# Patient Record
Sex: Female | Born: 1950 | Race: White | Hispanic: No | Marital: Married
Health system: Southern US, Community
[De-identification: ages and names within clinical notes are randomized; demographics above are authoritative.]

---

## 1999-05-02 ENCOUNTER — Ambulatory Visit (HOSPITAL_COMMUNITY): Admission: RE | Admit: 1999-05-02 | Discharge: 1999-05-02 | Payer: Self-pay | Admitting: Family Medicine

## 1999-05-02 ENCOUNTER — Encounter: Payer: Self-pay | Admitting: Family Medicine

## 1999-05-15 ENCOUNTER — Encounter: Admission: RE | Admit: 1999-05-15 | Discharge: 1999-08-13 | Payer: Self-pay | Admitting: Neurosurgery

## 1999-12-31 ENCOUNTER — Encounter: Payer: Self-pay | Admitting: *Deleted

## 1999-12-31 ENCOUNTER — Encounter: Admission: RE | Admit: 1999-12-31 | Discharge: 1999-12-31 | Payer: Self-pay | Admitting: *Deleted

## 2000-12-22 ENCOUNTER — Other Ambulatory Visit: Admission: RE | Admit: 2000-12-22 | Discharge: 2000-12-22 | Payer: Self-pay | Admitting: Obstetrics and Gynecology

## 2001-01-02 ENCOUNTER — Encounter: Payer: Self-pay | Admitting: Obstetrics and Gynecology

## 2001-01-02 ENCOUNTER — Encounter: Admission: RE | Admit: 2001-01-02 | Discharge: 2001-01-02 | Payer: Self-pay | Admitting: Obstetrics and Gynecology

## 2002-01-17 ENCOUNTER — Encounter: Admission: RE | Admit: 2002-01-17 | Discharge: 2002-01-17 | Payer: Self-pay | Admitting: Obstetrics and Gynecology

## 2002-01-17 ENCOUNTER — Encounter: Payer: Self-pay | Admitting: Obstetrics and Gynecology

## 2002-01-22 ENCOUNTER — Other Ambulatory Visit: Admission: RE | Admit: 2002-01-22 | Discharge: 2002-01-22 | Payer: Self-pay | Admitting: Obstetrics and Gynecology

## 2003-02-04 ENCOUNTER — Encounter: Admission: RE | Admit: 2003-02-04 | Discharge: 2003-02-04 | Payer: Self-pay | Admitting: Obstetrics and Gynecology

## 2003-02-26 ENCOUNTER — Other Ambulatory Visit: Admission: RE | Admit: 2003-02-26 | Discharge: 2003-02-26 | Payer: Self-pay | Admitting: Obstetrics and Gynecology

## 2004-04-17 ENCOUNTER — Other Ambulatory Visit: Admission: RE | Admit: 2004-04-17 | Discharge: 2004-04-17 | Payer: Self-pay | Admitting: Obstetrics and Gynecology

## 2004-04-27 ENCOUNTER — Ambulatory Visit (HOSPITAL_COMMUNITY): Admission: RE | Admit: 2004-04-27 | Discharge: 2004-04-27 | Payer: Self-pay | Admitting: Obstetrics and Gynecology

## 2004-08-11 ENCOUNTER — Ambulatory Visit (HOSPITAL_COMMUNITY): Admission: RE | Admit: 2004-08-11 | Discharge: 2004-08-11 | Payer: Self-pay | Admitting: Orthopaedic Surgery

## 2004-08-11 ENCOUNTER — Ambulatory Visit (HOSPITAL_BASED_OUTPATIENT_CLINIC_OR_DEPARTMENT_OTHER): Admission: RE | Admit: 2004-08-11 | Discharge: 2004-08-11 | Payer: Self-pay | Admitting: Orthopaedic Surgery

## 2005-05-18 ENCOUNTER — Other Ambulatory Visit: Admission: RE | Admit: 2005-05-18 | Discharge: 2005-05-18 | Payer: Self-pay | Admitting: Obstetrics and Gynecology

## 2005-11-17 ENCOUNTER — Encounter: Admission: RE | Admit: 2005-11-17 | Discharge: 2005-11-17 | Payer: Self-pay | Admitting: Family Medicine

## 2007-08-21 ENCOUNTER — Encounter: Admission: RE | Admit: 2007-08-21 | Discharge: 2007-08-21 | Payer: Self-pay | Admitting: Family Medicine

## 2007-11-28 ENCOUNTER — Encounter: Admission: RE | Admit: 2007-11-28 | Discharge: 2007-11-28 | Payer: Self-pay | Admitting: Family Medicine

## 2008-09-26 ENCOUNTER — Ambulatory Visit (HOSPITAL_COMMUNITY): Admission: RE | Admit: 2008-09-26 | Discharge: 2008-09-26 | Payer: Self-pay | Admitting: Family Medicine

## 2009-02-25 ENCOUNTER — Encounter: Admission: RE | Admit: 2009-02-25 | Discharge: 2009-02-25 | Payer: Self-pay | Admitting: Family Medicine

## 2010-01-26 ENCOUNTER — Ambulatory Visit (HOSPITAL_COMMUNITY)
Admission: RE | Admit: 2010-01-26 | Discharge: 2010-01-26 | Payer: Self-pay | Source: Home / Self Care | Admitting: Family Medicine

## 2010-03-29 ENCOUNTER — Encounter: Payer: Self-pay | Admitting: Family Medicine

## 2010-03-30 ENCOUNTER — Encounter: Payer: Self-pay | Admitting: Gastroenterology

## 2010-07-24 NOTE — Op Note (Signed)
[name], MONCEAUX               ACCOUNT NO.:  [id number]   MEDICAL RECORD NO.:  [id number]          PATIENT TYPE:  AMB   LOCATION:  DSC                          FACILITY:  [facility]   PHYSICIAN:  Lubertha Basque. Dalldorf, M.D.DATE OF BIRTH:  08-Feb-1951   DATE OF PROCEDURE:  08/11/2004  DATE OF DISCHARGE:                                 OPERATIVE REPORT   PREOPERATIVE DIAGNOSES:  1. Right knee torn medial torn lateral menisci.  2. Right knee degenerative joint disease.   POSTOPERATIVE DIAGNOSES:  1. Right knee torn medial torn lateral menisci.  2. Right knee degenerative joint disease.   PROCEDURES:  1. One right knee partial medial partial lateral meniscectomies.  2. Right knee abrasion chondroplasty.   ANESTHESIA:  General.   ATTENDING SURGEON:  Lubertha Basque. Jerl Santos, M.D.   ASSISTANTHarolyn Rutherford, P.A.   INDICATIONS FOR PROCEDURE:  The patient is a 60 year old woman with long  history of right knee pain and swelling.  This has persisted despite oral  anti-inflammatories and an injection.  She undergone an MRI scan which shows  torn medial  and lateral menisci along with some degenerative changes.  She  has pain at rest pain and pain which limits her activities, and she is  offered an arthroscopy.  Informed operative consent was obtained after  discussion of the possible complications of, reaction to anesthesia, and  infection.   DESCRIPTION OF PROCEDURE:  The patient was taken to the operating suite,  where a general anesthetic was applied without  difficulty.  She was positioned supine and prepped in a normal sterile fashion.  After  the administration of preop IV antibiotics, an arthroscopy of the right knee  was performed through two inferior portals.  The suprapatellar pouch was  benign, while the patellofemoral joint exhibited some grade 2 and grade 3  change, where a mild chondroplasty was required.  In the medial compartment,  she did have a degenerative tear of the middle and  posterior horn of the  medial meniscus.  A 10-15% partial medial meniscectomy was accomplished back  to a stable rim of tissue.  Of greater significance was bare bone on the  tibial plateau in the area of this tear.  She had an area half the size of  the time which was completely devoid of cartilage.  A brief abrasion was  done here to some bleeding bone.  She had a broader area on the femur with  bare bone as well.  In the lateral compartment, she had a degenerative tear  of the posterior horn of the lateral meniscus requiring about a 10% partial  the lateral meniscectomy.  She did have an area of grade 4 change about the size of a dime on the  lateral tibial plateau near the central portion of the joint.  An abrasion  was done here as well back to some bleeding bone.  The ACL appeared to be  intact.  The knee was thoroughly irrigated at the end of the case, followed  by injection with Marcaine with epinephrine and morphine plus some Depo-  Medrol.  Adaptic  was placed over her portals, followed by dry gauze and a  loose Ace wrap.  Estimated blood loss and intraoperative fluids can be  obtained from anesthesia records.   DISPOSITION:  The patient was extubated in the operating room and taken to  the recovery room in stable addition.  Plans were for her to go home the same day and to follow up in the office in  less than a week.  I will contact her by phone tonight.       PGD/MEDQ  D:  08/11/2004  T:  08/11/2004  Job:  [id number]

## 2010-07-30 ENCOUNTER — Other Ambulatory Visit: Payer: Self-pay | Admitting: Gastroenterology

## 2010-07-30 DIAGNOSIS — R1032 Left lower quadrant pain: Secondary | ICD-10-CM

## 2010-08-07 ENCOUNTER — Ambulatory Visit
Admission: RE | Admit: 2010-08-07 | Discharge: 2010-08-07 | Disposition: A | Discharge status: Home / Self Care | Payer: BC Managed Care – PPO | Source: Ambulatory Visit | Attending: Gastroenterology | Admitting: Gastroenterology

## 2010-08-07 DIAGNOSIS — R1032 Left lower quadrant pain: Secondary | ICD-10-CM

## 2010-09-02 ENCOUNTER — Other Ambulatory Visit (HOSPITAL_COMMUNITY): Payer: Self-pay | Admitting: Orthopedic Surgery

## 2010-09-02 ENCOUNTER — Other Ambulatory Visit: Payer: Self-pay | Admitting: Orthopedic Surgery

## 2010-09-02 ENCOUNTER — Encounter (HOSPITAL_COMMUNITY): Payer: BC Managed Care – PPO

## 2010-09-02 ENCOUNTER — Ambulatory Visit (HOSPITAL_COMMUNITY)
Admission: RE | Admit: 2010-09-02 | Discharge: 2010-09-02 | Disposition: A | Discharge status: Home / Self Care | Payer: BC Managed Care – PPO | Source: Ambulatory Visit | Attending: Orthopedic Surgery | Admitting: Orthopedic Surgery

## 2010-09-02 DIAGNOSIS — M1990 Unspecified osteoarthritis, unspecified site: Secondary | ICD-10-CM

## 2010-09-02 DIAGNOSIS — J4489 Other specified chronic obstructive pulmonary disease: Secondary | ICD-10-CM | POA: Insufficient documentation

## 2010-09-02 DIAGNOSIS — Z01812 Encounter for preprocedural laboratory examination: Secondary | ICD-10-CM | POA: Insufficient documentation

## 2010-09-02 DIAGNOSIS — F17200 Nicotine dependence, unspecified, uncomplicated: Secondary | ICD-10-CM | POA: Insufficient documentation

## 2010-09-02 DIAGNOSIS — I10 Essential (primary) hypertension: Secondary | ICD-10-CM | POA: Insufficient documentation

## 2010-09-02 DIAGNOSIS — Z01810 Encounter for preprocedural cardiovascular examination: Secondary | ICD-10-CM | POA: Insufficient documentation

## 2010-09-02 DIAGNOSIS — J449 Chronic obstructive pulmonary disease, unspecified: Secondary | ICD-10-CM | POA: Insufficient documentation

## 2010-09-02 DIAGNOSIS — Z01818 Encounter for other preprocedural examination: Secondary | ICD-10-CM | POA: Insufficient documentation

## 2010-09-02 DIAGNOSIS — M1710 Unilateral primary osteoarthritis, unspecified knee: Secondary | ICD-10-CM | POA: Insufficient documentation

## 2010-09-02 LAB — URINALYSIS, ROUTINE W REFLEX MICROSCOPIC
Ketones, ur: NEGATIVE mg/dL
Leukocytes, UA: NEGATIVE
Nitrite: NEGATIVE
Protein, ur: NEGATIVE mg/dL
Urobilinogen, UA: 0.2 mg/dL (ref 0.0–1.0)
pH: 6 (ref 5.0–8.0)

## 2010-09-02 LAB — COMPREHENSIVE METABOLIC PANEL
AST: 33 U/L (ref 0–37)
Albumin: 4.2 g/dL (ref 3.5–5.2)
Calcium: 9.8 mg/dL (ref 8.4–10.5)
Chloride: 93 mEq/L — ABNORMAL LOW (ref 96–112)
Creatinine, Ser: 0.85 mg/dL (ref 0.50–1.10)
Total Protein: 7.4 g/dL (ref 6.0–8.3)

## 2010-09-02 LAB — CBC
HCT: 38.1 % (ref 36.0–46.0)
Hemoglobin: 12.9 g/dL (ref 12.0–15.0)
RDW: 12.6 % (ref 11.5–15.5)
WBC: 6.9 10*3/uL (ref 4.0–10.5)

## 2010-09-02 LAB — DIFFERENTIAL
Basophils Absolute: 0 10*3/uL (ref 0.0–0.1)
Lymphocytes Relative: 33 % (ref 12–46)
Monocytes Absolute: 0.6 10*3/uL (ref 0.1–1.0)
Monocytes Relative: 9 % (ref 3–12)
Neutro Abs: 3.8 10*3/uL (ref 1.7–7.7)

## 2010-09-02 LAB — SURGICAL PCR SCREEN: Staphylococcus aureus: NEGATIVE

## 2010-09-02 LAB — APTT: aPTT: 33 seconds (ref 24–37)

## 2010-09-10 ENCOUNTER — Inpatient Hospital Stay (HOSPITAL_COMMUNITY)
Admission: RE | Admit: 2010-09-10 | Discharge: 2010-09-14 | DRG: 209 | Disposition: A | Discharge status: Skilled Nursing Facility | Payer: BC Managed Care – PPO | Source: Ambulatory Visit | Attending: Orthopedic Surgery | Admitting: Orthopedic Surgery

## 2010-09-10 DIAGNOSIS — M1710 Unilateral primary osteoarthritis, unspecified knee: Principal | ICD-10-CM | POA: Diagnosis present

## 2010-09-10 DIAGNOSIS — I10 Essential (primary) hypertension: Secondary | ICD-10-CM | POA: Diagnosis present

## 2010-09-10 DIAGNOSIS — G4733 Obstructive sleep apnea (adult) (pediatric): Secondary | ICD-10-CM | POA: Diagnosis present

## 2010-09-10 DIAGNOSIS — E876 Hypokalemia: Secondary | ICD-10-CM | POA: Diagnosis not present

## 2010-09-10 DIAGNOSIS — F17200 Nicotine dependence, unspecified, uncomplicated: Secondary | ICD-10-CM | POA: Diagnosis present

## 2010-09-10 DIAGNOSIS — Z01812 Encounter for preprocedural laboratory examination: Secondary | ICD-10-CM

## 2010-09-10 DIAGNOSIS — D62 Acute posthemorrhagic anemia: Secondary | ICD-10-CM | POA: Diagnosis not present

## 2010-09-10 DIAGNOSIS — K5900 Constipation, unspecified: Secondary | ICD-10-CM | POA: Diagnosis not present

## 2010-09-11 LAB — CBC
MCH: 30.1 pg (ref 26.0–34.0)
MCHC: 34.7 g/dL (ref 30.0–36.0)
MCV: 87 fL (ref 78.0–100.0)
Platelets: 293 10*3/uL (ref 150–400)

## 2010-09-11 LAB — BASIC METABOLIC PANEL
Calcium: 8.4 mg/dL (ref 8.4–10.5)
Creatinine, Ser: 0.67 mg/dL (ref 0.50–1.10)
GFR calc non Af Amer: >60 mL/min (ref >60)
Glucose, Bld: 134 mg/dL — ABNORMAL HIGH (ref 70–99)
Sodium: 136 mEq/L (ref 135–145)

## 2010-09-12 LAB — CBC
MCH: 30.2 pg (ref 26.0–34.0)
Platelets: 259 10*3/uL (ref 150–400)
RBC: 3.01 MIL/uL — ABNORMAL LOW (ref 3.87–5.11)
RDW: 13 % (ref 11.5–15.5)
WBC: 10.8 10*3/uL — ABNORMAL HIGH (ref 4.0–10.5)

## 2010-09-12 LAB — BASIC METABOLIC PANEL
CO2: 28 mEq/L (ref 19–32)
Calcium: 8.4 mg/dL (ref 8.4–10.5)
Creatinine, Ser: 0.62 mg/dL (ref 0.50–1.10)
GFR calc Af Amer: >60 mL/min (ref >60)
Sodium: 136 mEq/L (ref 135–145)

## 2010-09-13 LAB — CBC
HCT: 24.1 % — ABNORMAL LOW (ref 36.0–46.0)
Hemoglobin: 8.5 g/dL — ABNORMAL LOW (ref 12.0–15.0)
MCH: 30.7 pg (ref 26.0–34.0)
MCHC: 35.3 g/dL (ref 30.0–36.0)
MCV: 87 fL (ref 78.0–100.0)
Platelets: 237 10*3/uL (ref 150–400)
RBC: 2.77 MIL/uL — ABNORMAL LOW (ref 3.87–5.11)
RDW: 13 % (ref 11.5–15.5)
WBC: 10.7 10*3/uL — ABNORMAL HIGH (ref 4.0–10.5)

## 2010-09-14 LAB — CROSSMATCH
ABO/RH(D): O POS
Antibody Screen: NEGATIVE
Unit division: 0

## 2010-09-14 LAB — COMPREHENSIVE METABOLIC PANEL
ALT: 31 U/L (ref 0–35)
AST: 24 U/L (ref 0–37)
Albumin: 2.9 g/dL — ABNORMAL LOW (ref 3.5–5.2)
Alkaline Phosphatase: 53 U/L (ref 39–117)
CO2: 27 mEq/L (ref 19–32)
Chloride: 98 mEq/L (ref 96–112)
GFR calc non Af Amer: >60 mL/min (ref >60)
Potassium: 4.3 mEq/L (ref 3.5–5.1)
Sodium: 131 mEq/L — ABNORMAL LOW (ref 135–145)
Total Bilirubin: 0.4 mg/dL (ref 0.3–1.2)

## 2010-09-23 NOTE — Discharge Summary (Addendum)
[name], [name]               ACCOUNT NO.:  [id number]  MEDICAL RECORD NO.:  [id number]  LOCATION:  1621                         FACILITY:  [facility]  PHYSICIAN:  John L. Rendall, M.D.  DATE OF BIRTH:  21-Feb-1951  DATE OF ADMISSION:  09/10/2010 DATE OF DISCHARGE:  09/14/2010                              DISCHARGE SUMMARY   ADMISSION DIAGNOSES: 1. End-stage osteoarthritis of bilateral knees, left worse than right. 2. Hypertension. 3. Hypercholesterolemia. 4. Gastroesophageal reflux disease. 5. Allergies.  DISCHARGE DIAGNOSES: 1. End-stage osteoarthritis of bilateral knees, left worse than right,     status post left total knee arthroplasty. 2. Acute blood loss anemia secondary to surgery. 3. Constipation, now resolved. 4. Hypertension. 5. Hypercholesterolemia. 6. Gastroesophageal reflux disease. 7. Allergies.  SURGICAL PROCEDURES:  On September 10, 2010, [name] underwent a left total knee arthroplasty with computer navigation by Dr. Jonny Ruiz L. Rendall assisted by Arnoldo Morale PA-C.  She had an LCS complete metal back patella size cemented size standard placed with a tibial tray rotating platform MBT keel size 2.5 cm cemented.  A primary femoral component cemented size standard left with an LCS complete tibial insert rotating platform 10-mm thickness.  COMPLICATIONS:  None.  CONSULTS:  Physical therapy and Occupational Therapy consult September 11, 2010, in addition to a case management consult.  HISTORY OF PRESENT ILLNESS:  This 60 year old white female patient presented with a 5- to 10-year history of gradual onset of progressive bilateral knee pain, left worse than right.  Left knee pain is now constant ache over the medial joint without radiation.  She has stiffness in the morning and pain increases with walking and standing and decreases with sitting and ice.  The knee pops, catches, grinds, gives away and swell.  She has failed conservative treatment and because of that  she is presenting for a left knee replacement.  HOSPITAL COURSE:  [name] tolerated her surgical procedure well without immediate postoperative complications.  She was transferred to the orthopedic floor.  Postop day #1, she was afebrile, vitals were stable.  Hemoglobin 10.4, hematocrit 30.  Hemovac was DC'ed.  She was weaned off her oxygen.  PCA was DC'ed after lunch and she was started on therapy per protocol.  Due to lack of assistance at home, plans were made for probable transfer to skilled at the time of discharge.  Postop day #2, she continued to be afebrile.  Dressing was intact without drainage.  Her Foley was DC'ed and she was voiding.  Hemoglobin was 9.1, hematocrit 26.1 and she was continued on therapy.  She continued to make good progress over the next several days.  Hemoglobin on September 13, 2010, was 8.5, hematocrit 24.1, but she was asymptomatic. She did have some constipation that was treated and on September 14, 2010, it is felt she is ready for transfer to skilled facility.  Her T-max is 99.1, vitals were stable.  Incision is well-approximated with staples with minimal drainage.  She is doing well with therapy and it is felt she is stable for discharge to the skilled facility today.  DISCHARGE INSTRUCTIONS:  DIET:  She is to resume her regular diet.  MEDICATIONS:  Please see the  patient med discharge instruction sheet with complete documentation of the dosages of her meds but we did add some new meds for her which is Celebrex, Robaxin, Percocet and Xarelto. The rest of her home meds are unchanged with the exception that she will be stopping her tramadol and her Tylenol PM.  ACTIVITY:  She is to be out of bed weightbearing as tolerated on the left leg with use of a walker.  She is to have home CPM 0 to 90 degrees 6 to 8 hours a day.  Please see the total joint discharge sheet for further activity instructions.  DRESSING:  The new Mepilex dressing was placed that can  may remain intact if unsoiled until Thursday or Friday of this week, at that time they can take it off and clean the incision daily with Betadine.  Please notify Dr. Priscille Kluver if temperature greater than 101.5, chills, pain unrelieved by pain meds or foul-smelling drainage from the wound.  She is to continue use of TED stocking to the leg to help with swelling. Please see the total joint discharge sheet for further wound care instructions.  FOLLOW-UP:  She is to follow up with Dr. Priscille Kluver in our office on Thursday September 24, 2010 and need to call [phone] for that appointment.  LABORATORY DATA:  Hemoglobin/hematocrit have ranged from 10.4 and 30 on the 6th to 8.5 and 24.1 on the 8th.  White count went from 8.1 on the 6th to 10.8 on the 7th to 10.7 on the 8th.  Platelets were within normal limits.  Her potassium dropped to a low of 3.0 on the 7th.  Glucose was 141 and 134 on the 7th and 6th.  All other laboratory studies were within normal limits.     Legrand Pitts Anara Cowman, P.A.   ______________________________ Carlisle Beers. Priscille Kluver, M.D.    KED/MEDQ  D:  09/14/2010  T:  09/14/2010  Job:  [id number]  Electronically Signed by Erasmo Leventhal M.D. on 09/23/2010 10:00:03 AM Electronically Signed by Arnoldo Morale P.A. on 09/23/2010 10:10:50 AM

## 2010-09-23 NOTE — Op Note (Signed)
[name], [name]               ACCOUNT NO.:  [id number]  MEDICAL RECORD NO.:  [id number]  LOCATION:  0006                         FACILITY:  [facility]  PHYSICIAN:  Afua Hoots L. Rendall, M.D.  DATE OF BIRTH:  Feb 21, 1951  DATE OF PROCEDURE:  09/10/2010 DATE OF DISCHARGE:                              OPERATIVE REPORT   PREOPERATIVE DIAGNOSIS:  Osteoarthritis, left knee.  SURGICAL PROCEDURES:  Left LCS total knee arthroplasty with computer navigation assistance.  POSTOPERATIVE DIAGNOSIS:  Osteoarthritis, left knee.  SURGEON:  Izael Bessinger L. Rendall, M.D.  ASSISTANT:  Legrand Pitts. Duffy, P.A.C - present and participating in entire procedure.  PATHOLOGY:  The patient has had bone against bone medially in the left knee known for at least the last 3 years.  She has tried cortisone injections and visco supplementation and medication and basically nothing has helped and she has finally reached to the point where the pain is not bearable  PROCEDURE:  Under general anesthetic with femoral nerve block, the left knee was prepared with DuraPrep and draped as a sterile field.  The legs were wrapped out with an Esmarch and sterile tourniquet was used at 350 mmHg.  Midline incision was made.  The patella was everted.  The femur was sized between a standard and a medium.  Debridement was done in preparation for computer mapping.  Schanz pins were placed through accessory punctures in the superior medial tibia and the distal femur. The arrays were set up, mapping was then done.  Once mapping was complete within 0.1 mm of reproducible accuracy, attention was then turned to  proximal tibial resection that was done with the tibial guide.  Balancing was then done using the balancer, the 7 degrees of varus present at the start of the procedure is stepwise released taking down spurs on the medial tibia and releasing the medial collateral ligament, superficial fibers to the posterior medial corner.   Once appropriately done, alignment came within 1-1/2 degrees of anatomic. The flexion gap was then measured.  The first femoral cut was then made and decision was made to go with a standard femoral component.  With the anterior and posterior flare of the distal femur resected, the flexion gap was approximately 10 mm.  Distal femoral cut was then made and extension gap approximately 10 mm.  Debridement was then done of the menisci and cruciate ligaments.  Spurs were taken off the back of the femoral condyle.  The recessing guide was then used.  At this point, the proximal tibia was exposed.  It was sized as a 2.5, center peg hole with keel was inserted.  Trial seating of a 2.5 tibia, 10 bearing standard femur revealed alignment within 1 degree of anatomic full extension and was stable to varus and valgus and drawer testing.  The patella was then osteotomized.  Trial patella also inserted.  With the capsule closed, the knee had excellent stability and permanent components were then obtained in these sizes.  Bony surfaces prepared with pulse irrigation. All components were cemented in place.  Once the cement hardened, the tourniquet was let down at 1 hour and 10 minutes.  Multiple small vessels were cauterized.  The knee was  then closed in layers over a medium Hemovac with #1 Tycron, #1 Vicryl, 2-0 Vicryl and skin clips. The patient returned to recovery in good condition.     Khamiyah Grefe L. Priscille Kluver, M.D.     Renato Gails  D:  09/10/2010  T:  09/10/2010  Job:  [id number]  Electronically Signed by Erasmo Leventhal M.D. on 09/23/2010 10:00:06 AM

## 2010-10-05 ENCOUNTER — Ambulatory Visit: Payer: BC Managed Care – PPO | Attending: Orthopedic Surgery | Admitting: Physical Therapy

## 2010-10-05 DIAGNOSIS — M25669 Stiffness of unspecified knee, not elsewhere classified: Secondary | ICD-10-CM | POA: Insufficient documentation

## 2010-10-05 DIAGNOSIS — IMO0001 Reserved for inherently not codable concepts without codable children: Secondary | ICD-10-CM | POA: Insufficient documentation

## 2010-10-05 DIAGNOSIS — R262 Difficulty in walking, not elsewhere classified: Secondary | ICD-10-CM | POA: Insufficient documentation

## 2010-10-05 DIAGNOSIS — Z96659 Presence of unspecified artificial knee joint: Secondary | ICD-10-CM | POA: Insufficient documentation

## 2010-10-05 DIAGNOSIS — M25569 Pain in unspecified knee: Secondary | ICD-10-CM | POA: Insufficient documentation

## 2010-10-06 ENCOUNTER — Ambulatory Visit: Payer: BC Managed Care – PPO | Admitting: Physical Therapy

## 2010-10-08 ENCOUNTER — Ambulatory Visit: Payer: BC Managed Care – PPO | Attending: Orthopedic Surgery | Admitting: Rehabilitation

## 2010-10-08 DIAGNOSIS — Z96659 Presence of unspecified artificial knee joint: Secondary | ICD-10-CM | POA: Insufficient documentation

## 2010-10-08 DIAGNOSIS — R262 Difficulty in walking, not elsewhere classified: Secondary | ICD-10-CM | POA: Insufficient documentation

## 2010-10-08 DIAGNOSIS — IMO0001 Reserved for inherently not codable concepts without codable children: Secondary | ICD-10-CM | POA: Insufficient documentation

## 2010-10-08 DIAGNOSIS — M25569 Pain in unspecified knee: Secondary | ICD-10-CM | POA: Insufficient documentation

## 2010-10-08 DIAGNOSIS — M25669 Stiffness of unspecified knee, not elsewhere classified: Secondary | ICD-10-CM | POA: Insufficient documentation

## 2010-10-12 ENCOUNTER — Ambulatory Visit: Payer: BC Managed Care – PPO | Admitting: Physical Therapy

## 2010-10-14 ENCOUNTER — Ambulatory Visit: Payer: BC Managed Care – PPO | Admitting: Physical Therapy

## 2010-10-16 ENCOUNTER — Ambulatory Visit: Payer: BC Managed Care – PPO | Admitting: Physical Therapy

## 2010-10-19 ENCOUNTER — Ambulatory Visit: Payer: BC Managed Care – PPO | Admitting: Physical Therapy

## 2010-10-21 ENCOUNTER — Ambulatory Visit: Payer: BC Managed Care – PPO | Admitting: Physical Therapy

## 2010-10-23 ENCOUNTER — Ambulatory Visit: Payer: BC Managed Care – PPO | Admitting: Physical Therapy

## 2010-10-26 ENCOUNTER — Encounter: Payer: BC Managed Care – PPO | Admitting: Physical Therapy

## 2010-10-28 ENCOUNTER — Ambulatory Visit: Payer: BC Managed Care – PPO | Admitting: Physical Therapy

## 2010-10-30 ENCOUNTER — Ambulatory Visit: Payer: BC Managed Care – PPO | Admitting: Physical Therapy

## 2010-11-04 ENCOUNTER — Ambulatory Visit: Payer: BC Managed Care – PPO | Admitting: Physical Therapy

## 2010-11-06 ENCOUNTER — Ambulatory Visit: Payer: BC Managed Care – PPO | Admitting: Physical Therapy

## 2010-11-10 ENCOUNTER — Ambulatory Visit: Payer: BC Managed Care – PPO | Attending: Orthopedic Surgery | Admitting: Physical Therapy

## 2010-11-10 DIAGNOSIS — M25569 Pain in unspecified knee: Secondary | ICD-10-CM | POA: Insufficient documentation

## 2010-11-10 DIAGNOSIS — M25669 Stiffness of unspecified knee, not elsewhere classified: Secondary | ICD-10-CM | POA: Insufficient documentation

## 2010-11-10 DIAGNOSIS — IMO0001 Reserved for inherently not codable concepts without codable children: Secondary | ICD-10-CM | POA: Insufficient documentation

## 2010-11-10 DIAGNOSIS — Z96659 Presence of unspecified artificial knee joint: Secondary | ICD-10-CM | POA: Insufficient documentation

## 2010-11-10 DIAGNOSIS — R262 Difficulty in walking, not elsewhere classified: Secondary | ICD-10-CM | POA: Insufficient documentation

## 2010-11-12 ENCOUNTER — Ambulatory Visit: Payer: BC Managed Care – PPO | Admitting: Physical Therapy

## 2010-11-17 ENCOUNTER — Ambulatory Visit: Payer: BC Managed Care – PPO | Admitting: Physical Therapy

## 2010-11-20 ENCOUNTER — Ambulatory Visit: Payer: BC Managed Care – PPO | Admitting: Physical Therapy

## 2010-11-23 ENCOUNTER — Ambulatory Visit: Payer: BC Managed Care – PPO | Admitting: Physical Therapy

## 2010-11-25 ENCOUNTER — Ambulatory Visit: Payer: BC Managed Care – PPO | Admitting: Physical Therapy

## 2010-12-21 ENCOUNTER — Other Ambulatory Visit (HOSPITAL_COMMUNITY): Payer: Self-pay | Admitting: Nurse Practitioner

## 2010-12-21 DIAGNOSIS — Z1231 Encounter for screening mammogram for malignant neoplasm of breast: Secondary | ICD-10-CM

## 2011-03-16 ENCOUNTER — Ambulatory Visit (HOSPITAL_COMMUNITY): Payer: BC Managed Care – PPO

## 2011-12-04 IMAGING — US US ABDOMEN COMPLETE
1 series · 14 of 25 positions shown · non-contrast
Comparison: We have no previous x-ray with which to compare.  The
abdomen and pelvis exam from  11/28/2007.

CLINICAL DATA: Left lower quadrant pain.  A rounded density in
upper abdomen seen on x-ray.

ABDOMEN ULTRASOUND
TECHNIQUE: Complete abdominal ultrasound examination was performed
including evaluation of the liver, gallbladder, bile ducts,
pancreas, kidneys, spleen, IVC, and abdominal aorta.

[Series 1: us abdomen complete · 0.20mm/px · 14 of 84 slices shown]
[im 1/84]
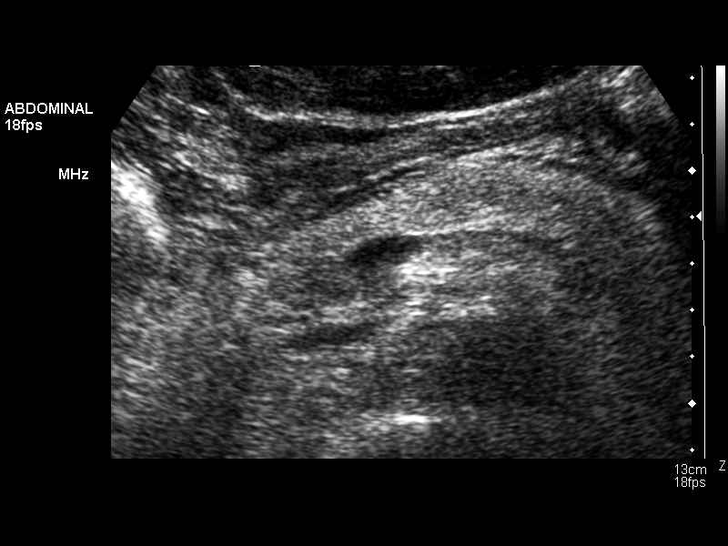
[im 7/84]
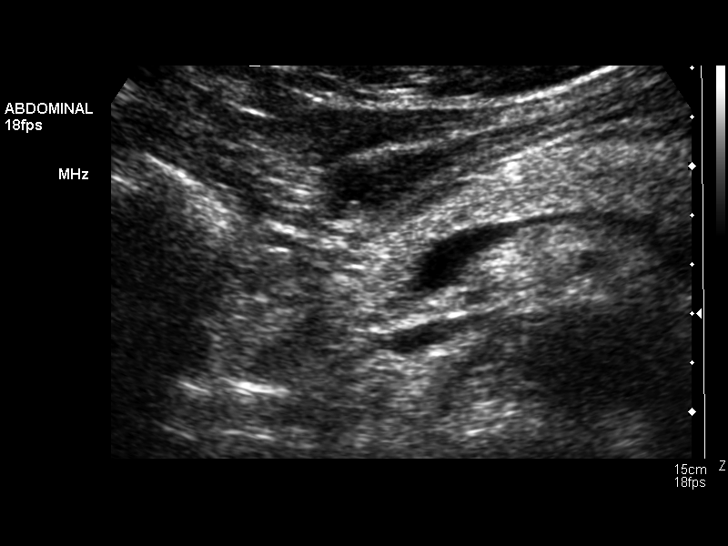
[im 14/84]
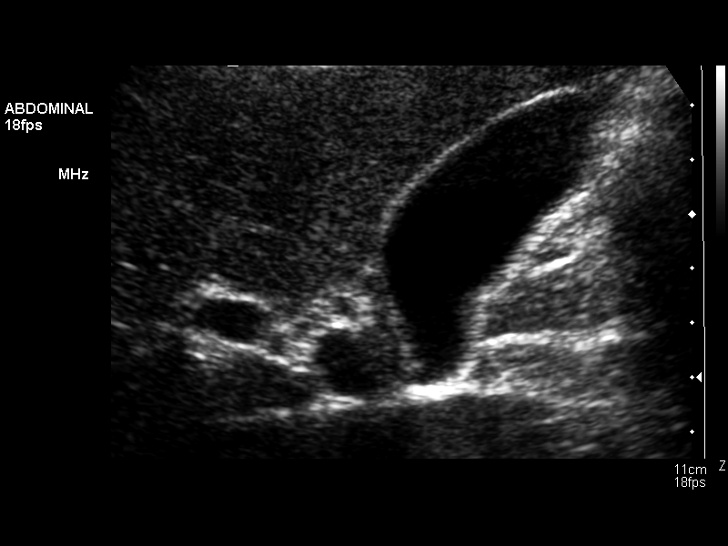
[im 21/84]
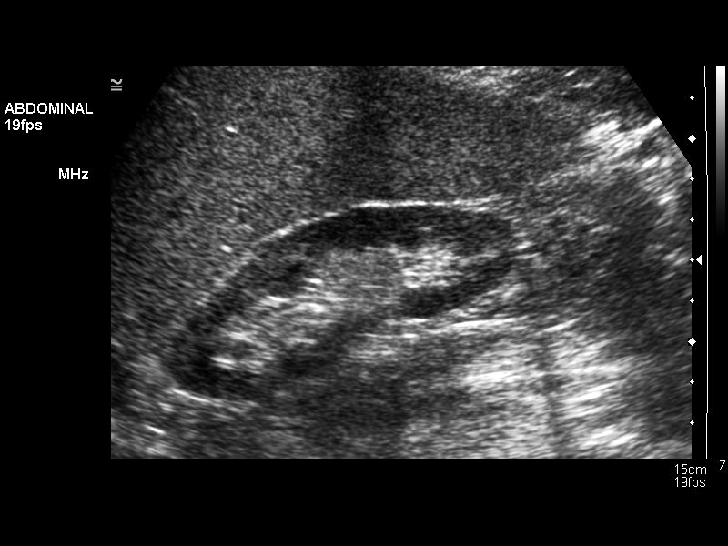
[im 28/84]
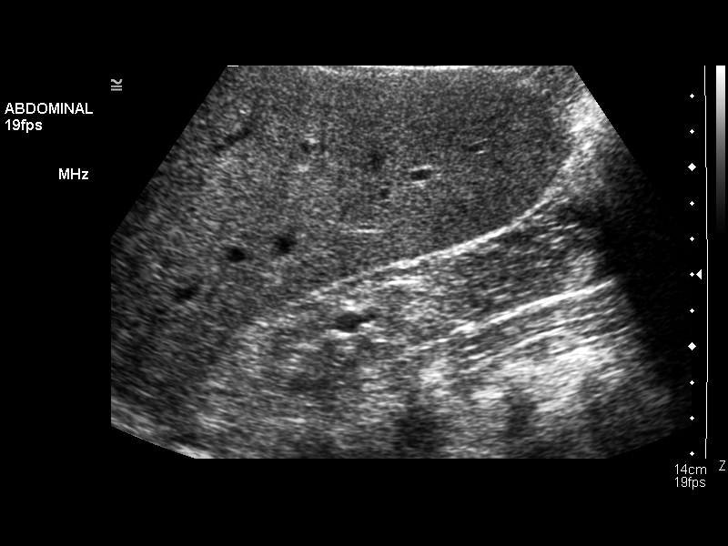
[im 32/84]
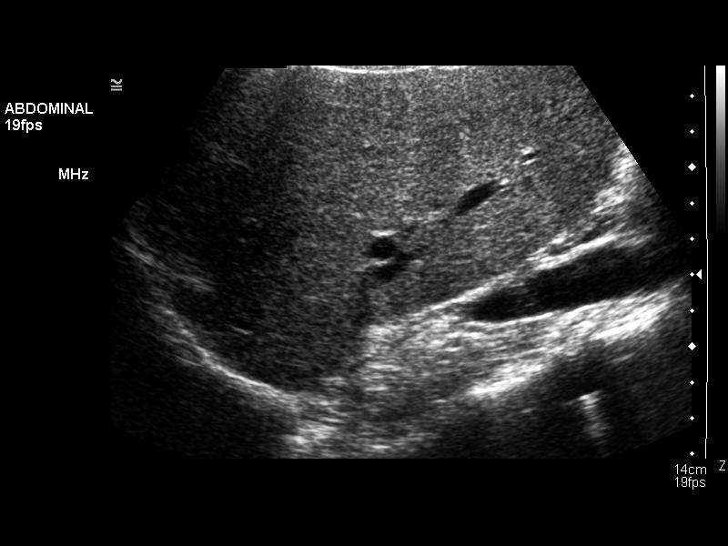
[im 39/84]
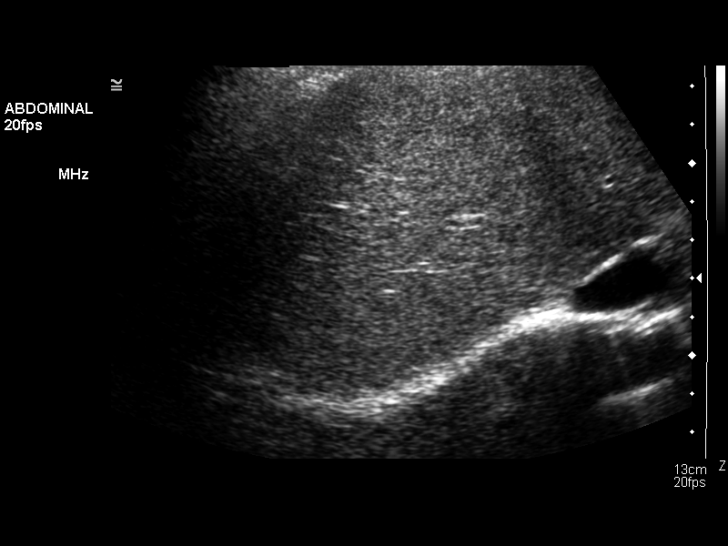
[im 45/84]
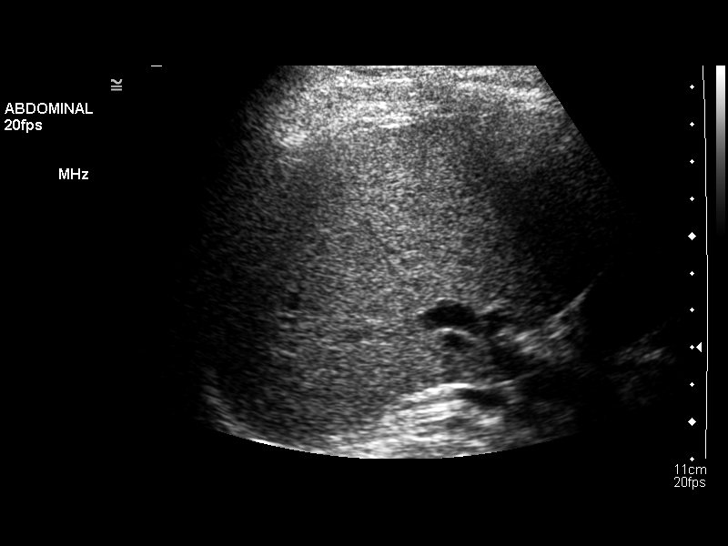
[im 52/84]
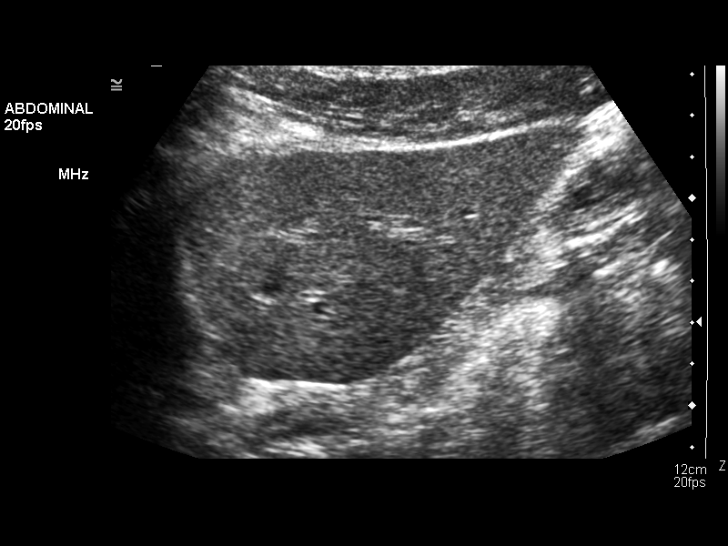
[im 56/84]
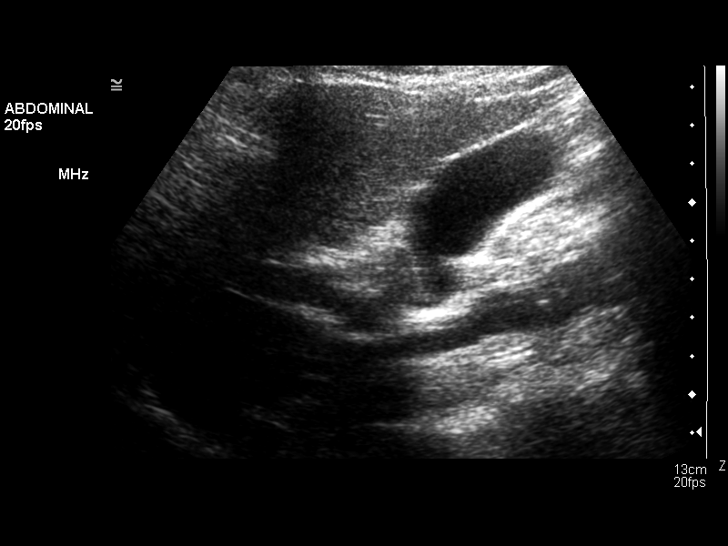
[im 63/84]
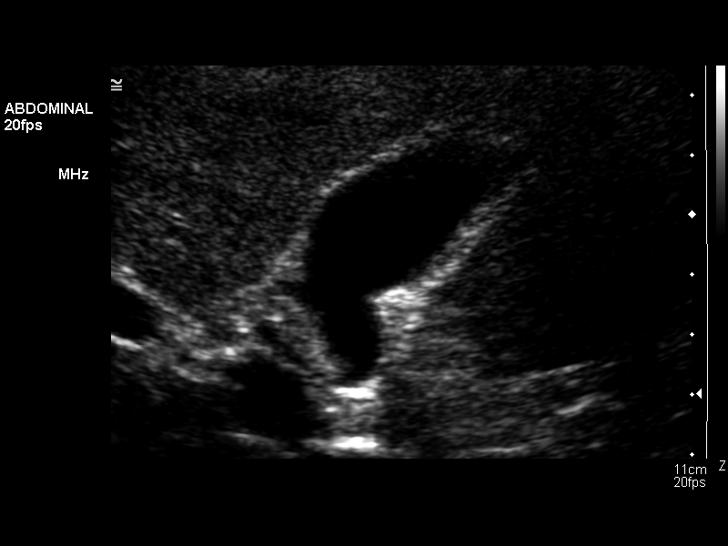
[im 70/84]
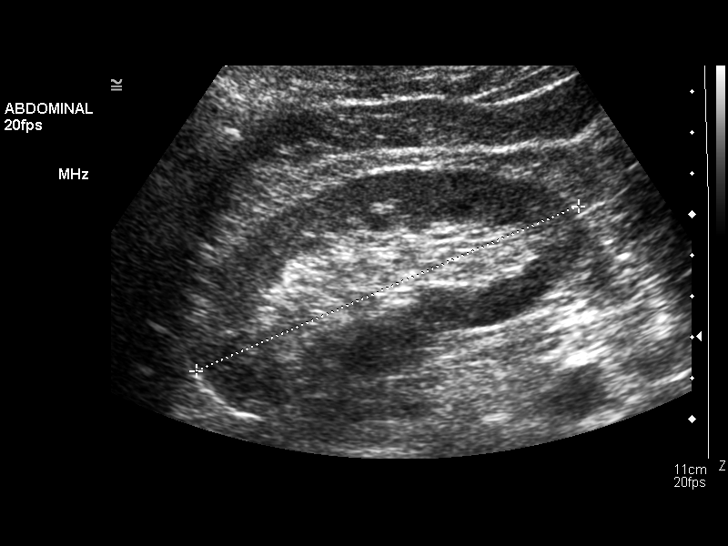
[im 77/84]
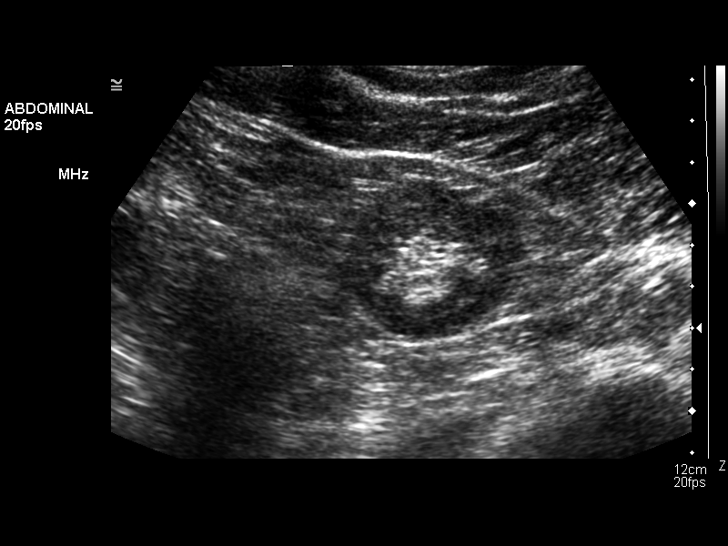
[im 84/84]
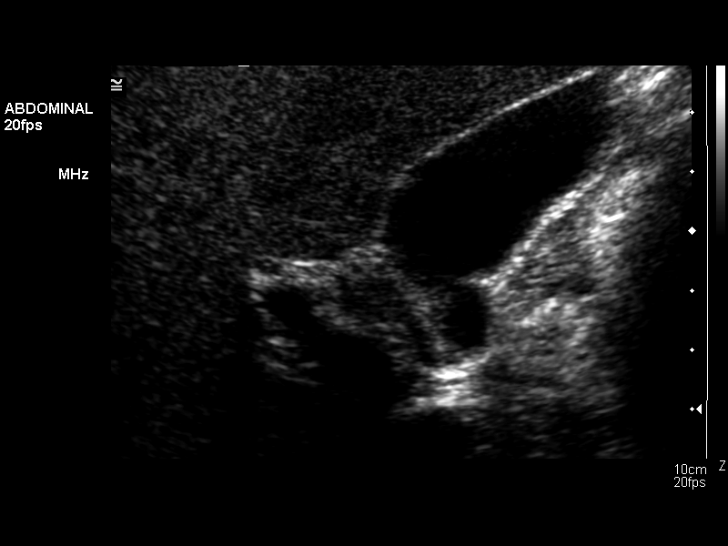

[14 of 25 positions shown; findings below may reference images not displayed]

FINDINGS: Gallbladder:  There is no evidence for gallstones.  No gallbladder
wall thickening or pericholecystic fluid.  The sonographer reports
no sonographic Murphy's sign.

Common Bile Duct:  Nondilated at 2-3 mm diameter.

Liver:  Mildly heterogeneous.  No focal abnormality.  No
intrahepatic biliary duct dilatation.

IVC:  Normal.

Pancreas:  Normal.

Spleen:  Normal.

Right kidney:  9.4 cm in long axis.  Normal.

Left kidney:  10.2 cm in long axis.  Normal.

Abdominal Aorta:  No aneurysm.
IMPRESSION: No evidence for cholelithiasis.  Unremarkable ultrasound exam of
the abdomen.

## 2012-07-17 ENCOUNTER — Other Ambulatory Visit: Payer: Self-pay | Admitting: Physician Assistant

## 2012-07-17 DIAGNOSIS — M79604 Pain in right leg: Secondary | ICD-10-CM

## 2012-07-19 ENCOUNTER — Other Ambulatory Visit: Payer: BC Managed Care – PPO

## 2012-08-03 ENCOUNTER — Encounter (INDEPENDENT_AMBULATORY_CARE_PROVIDER_SITE_OTHER): Payer: BC Managed Care – PPO

## 2012-08-03 DIAGNOSIS — R609 Edema, unspecified: Secondary | ICD-10-CM

## 2012-08-03 DIAGNOSIS — M7989 Other specified soft tissue disorders: Secondary | ICD-10-CM

## 2013-02-13 ENCOUNTER — Other Ambulatory Visit: Payer: Self-pay | Admitting: Internal Medicine

## 2013-03-29 ENCOUNTER — Other Ambulatory Visit: Payer: Self-pay | Admitting: Internal Medicine

## 2013-07-11 ENCOUNTER — Other Ambulatory Visit: Payer: Self-pay | Admitting: Physician Assistant

## 2014-03-26 ENCOUNTER — Telehealth: Payer: Self-pay

## 2014-03-26 NOTE — Telephone Encounter (Signed)
Left message to call back  

## 2014-03-26 NOTE — Telephone Encounter (Signed)
-----   Message from Louis Meckelobert D Kaplan, MD sent at 03/26/2014  2:25 PM EST ----- Try obtaining a release of information and then [facility] ----- Message -----    From: Heber CarolinaElizabeth Davie Claud, LPN    Sent: 1/61/09601/18/2016   4:27 PM      To: Louis Meckelobert D Kaplan, MD  "Care Everywhere" is blocked on this patient for me. There is no information prior to 2012.  ----- Message -----    From: Louis Meckelobert D Kaplan, MD    Sent: 03/25/2014  10:28 AM      To: Heber CarolinaElizabeth Garhett Bernhard, LPN  Patient apparently had a procedure and [facility] path [facility].  Could you see if you could pull up this information please

## 2014-04-01 NOTE — Telephone Encounter (Signed)
Spoke with this patient. She says she lives in [place] and has for several years now. She is not seeking GI help presently.

## 2014-04-01 NOTE — Telephone Encounter (Signed)
I have left message for the patient to call back. Home number is disconnected. V/m does identify as the patient.
